# Patient Record
Sex: Female | Born: 1945 | Race: White | Hispanic: No | Marital: Married | Smoking: Never smoker
Health system: Southern US, Community
[De-identification: ages and names within clinical notes are randomized; demographics above are authoritative.]

## PROBLEM LIST (undated history)

## (undated) DIAGNOSIS — I10 Essential (primary) hypertension: Secondary | ICD-10-CM

## (undated) DIAGNOSIS — F32A Depression, unspecified: Secondary | ICD-10-CM

## (undated) DIAGNOSIS — I499 Cardiac arrhythmia, unspecified: Secondary | ICD-10-CM

## (undated) DIAGNOSIS — C801 Malignant (primary) neoplasm, unspecified: Secondary | ICD-10-CM

## (undated) DIAGNOSIS — K219 Gastro-esophageal reflux disease without esophagitis: Secondary | ICD-10-CM

## (undated) DIAGNOSIS — F329 Major depressive disorder, single episode, unspecified: Secondary | ICD-10-CM

## (undated) DIAGNOSIS — M1990 Unspecified osteoarthritis, unspecified site: Secondary | ICD-10-CM

## (undated) HISTORY — PX: ABDOMINAL HYSTERECTOMY: SHX81

## (undated) HISTORY — PX: CHOLECYSTECTOMY: SHX55

---

## 2016-03-10 ENCOUNTER — Emergency Department
Admission: EM | Admit: 2016-03-10 | Discharge: 2016-03-10 | Disposition: A | Discharge status: Home / Self Care | Payer: Medicare Other | Attending: Emergency Medicine | Admitting: Emergency Medicine

## 2016-03-10 ENCOUNTER — Encounter: Payer: Self-pay | Admitting: Emergency Medicine

## 2016-03-10 ENCOUNTER — Emergency Department: Payer: Medicare Other

## 2016-03-10 DIAGNOSIS — K859 Acute pancreatitis without necrosis or infection, unspecified: Secondary | ICD-10-CM | POA: Diagnosis not present

## 2016-03-10 DIAGNOSIS — Z859 Personal history of malignant neoplasm, unspecified: Secondary | ICD-10-CM | POA: Insufficient documentation

## 2016-03-10 DIAGNOSIS — R748 Abnormal levels of other serum enzymes: Secondary | ICD-10-CM | POA: Insufficient documentation

## 2016-03-10 DIAGNOSIS — I10 Essential (primary) hypertension: Secondary | ICD-10-CM | POA: Insufficient documentation

## 2016-03-10 DIAGNOSIS — M1990 Unspecified osteoarthritis, unspecified site: Secondary | ICD-10-CM | POA: Insufficient documentation

## 2016-03-10 DIAGNOSIS — R1013 Epigastric pain: Secondary | ICD-10-CM | POA: Diagnosis present

## 2016-03-10 DIAGNOSIS — F329 Major depressive disorder, single episode, unspecified: Secondary | ICD-10-CM | POA: Diagnosis not present

## 2016-03-10 HISTORY — DX: Major depressive disorder, single episode, unspecified: F32.9

## 2016-03-10 HISTORY — DX: Essential (primary) hypertension: I10

## 2016-03-10 HISTORY — DX: Unspecified osteoarthritis, unspecified site: M19.90

## 2016-03-10 HISTORY — DX: Depression, unspecified: F32.A

## 2016-03-10 HISTORY — DX: Malignant (primary) neoplasm, unspecified: C80.1

## 2016-03-10 HISTORY — DX: Cardiac arrhythmia, unspecified: I49.9

## 2016-03-10 HISTORY — DX: Gastro-esophageal reflux disease without esophagitis: K21.9

## 2016-03-10 LAB — COMPREHENSIVE METABOLIC PANEL
ALK PHOS: 88 U/L (ref 38–126)
ALT: 31 U/L (ref 14–54)
ANION GAP: 10 (ref 5–15)
AST: 56 U/L — ABNORMAL HIGH (ref 15–41)
Albumin: 4.3 g/dL (ref 3.5–5.0)
BILIRUBIN TOTAL: 0.6 mg/dL (ref 0.3–1.2)
BUN: 21 mg/dL — ABNORMAL HIGH (ref 6–20)
CALCIUM: 9.7 mg/dL (ref 8.9–10.3)
CO2: 28 mmol/L (ref 22–32)
Chloride: 101 mmol/L (ref 101–111)
Creatinine, Ser: 0.76 mg/dL (ref 0.44–1.00)
GLUCOSE: 103 mg/dL — AB (ref 65–99)
Potassium: 2.9 mmol/L — CL (ref 3.5–5.1)
SODIUM: 139 mmol/L (ref 135–145)
TOTAL PROTEIN: 7.7 g/dL (ref 6.5–8.1)

## 2016-03-10 LAB — TROPONIN I

## 2016-03-10 LAB — LIPASE, BLOOD: LIPASE: 90 U/L — AB (ref 11–51)

## 2016-03-10 LAB — CBC WITH DIFFERENTIAL/PLATELET
BASOS ABS: 0.1 10*3/uL (ref 0–0.1)
Basophils Relative: 1 %
EOS ABS: 0.3 10*3/uL (ref 0–0.7)
Eosinophils Relative: 4 %
HCT: 36.4 % (ref 35.0–47.0)
HEMOGLOBIN: 12.3 g/dL (ref 12.0–16.0)
LYMPHS ABS: 3.6 10*3/uL (ref 1.0–3.6)
LYMPHS PCT: 37 %
MCH: 31.3 pg (ref 26.0–34.0)
MCHC: 33.7 g/dL (ref 32.0–36.0)
MCV: 92.9 fL (ref 80.0–100.0)
MONO ABS: 0.8 10*3/uL (ref 0.2–0.9)
MONOS PCT: 8 %
NEUTROS ABS: 5 10*3/uL (ref 1.4–6.5)
Neutrophils Relative %: 50 %
PLATELETS: 309 10*3/uL (ref 150–440)
RBC: 3.92 MIL/uL (ref 3.80–5.20)
RDW: 13.4 % (ref 11.5–14.5)
WBC: 9.9 10*3/uL (ref 3.6–11.0)

## 2016-03-10 MED ORDER — OXYCODONE-ACETAMINOPHEN 5-325 MG PO TABS: 1.0000 | ORAL_TABLET | ORAL | Status: AC | PRN

## 2016-03-10 MED ORDER — BARIUM SULFATE 2.1 % PO SUSP: 450.0000 mL | ORAL | Status: AC

## 2016-03-10 MED ORDER — POTASSIUM CHLORIDE CRYS ER 20 MEQ PO TBCR
40.0000 meq | EXTENDED_RELEASE_TABLET | Freq: Once | ORAL | Status: AC
  Administered 2016-03-10: 40 meq via ORAL
  Filled 2016-03-10: qty 2

## 2016-03-10 MED ORDER — MORPHINE SULFATE (PF) 4 MG/ML IV SOLN
4.0000 mg | Freq: Once | INTRAVENOUS | Status: AC
  Administered 2016-03-10: 4 mg via INTRAVENOUS
  Filled 2016-03-10: qty 1

## 2016-03-10 MED ORDER — ONDANSETRON HCL 4 MG/2ML IJ SOLN
4.0000 mg | Freq: Once | INTRAMUSCULAR | Status: AC
Start: 2016-03-10 — End: 2016-03-10
  Administered 2016-03-10: 4 mg via INTRAVENOUS
  Filled 2016-03-10: qty 2

## 2016-03-10 NOTE — ED Provider Notes (Signed)
Signout from Dr. Archie Balboa in this patient who presented with epigastric and lower chest pain. Has a very reassuring CAT scan as well as a normal second troponin. The patient is able to eat and drink at this time and is feeling much relief. The plan per Dr. Archie Balboa was to discharge the patient after by mouth challenge. She was able tolerate her by mouth contrast as well as [employer]. Physical Exam  BP 132/65 mmHg  Pulse 75  Temp(Src) 97.8 F (36.6 C)  Resp 24  Ht 5\' 4"  (1.626 m)  Wt 170 lb (77.111 kg)  BMI 29.17 kg/m2  SpO2 98% ----------------------------------------- 6:47 PM on 03/10/2016 -----------------------------------------   Physical Exam Patient resting up with this time. No complaints at this time. ED Course  Procedures  MDM  CT Abdomen Pelvis Wo Contrast (Final result) Result time: 03/10/16 17:43:46   Final result by Rad Results In Interface (03/10/16 17:43:46)   Narrative:   CLINICAL DATA: Epigastric pain  EXAM: CT ABDOMEN AND PELVIS WITHOUT CONTRAST  TECHNIQUE: Multidetector CT imaging of the abdomen and pelvis was performed following the standard protocol without IV contrast.  COMPARISON: None.  FINDINGS: Lung bases are free of acute infiltrate or sizable effusion.  Gallbladder has been surgically removed. The liver, spleen, adrenal glands and pancreas are within normal limits. Considerable amount retained food stuffs are noted within the stomach. The kidneys show no renal calculi or obstructive changes. The ureters are visualized to the level of the urinary bladder without focal abnormality. The bladder is well distended.  No pelvic mass lesion is seen. The uterus has been surgically removed. The appendix is within normal limits. Fecal material is noted throughout the colon without obstructive change. Mild aortoiliac calcifications are seen without aneurysmal dilatation. A sclerotic focus is noted within the inferior aspect of L2 is noted likely  related to a bone island. Facet hypertrophic changes are noted.  IMPRESSION: Postsurgical changes.  No acute abnormality noted.   Electronically Signed By: Inez Catalina M.D. On: 03/10/2016 17:43          DG Chest 2 View (Final result) Result time: 03/10/16 14:55:23   Final result by Rad Results In Interface (03/10/16 14:55:23)   Narrative:   CLINICAL DATA: Sudden onset of chest pain radiating to the back with diaphoresis.  EXAM: CHEST 2 VIEW  COMPARISON: None.  FINDINGS: Heart size is normal. Slight tortuosity of the thoracic aorta. The lungs are clear. The vascularity is normal. No effusions. No significant bone finding.  IMPRESSION: No active cardiopulmonary disease.   Electronically Signed By: Nelson Chimes M.D. On: 03/10/2016 14:55      Patient without signs of cardiac injury on lab work.  Feeling improved at this time. Has been seeing a cardiologist as an outpatient and is planning for a catheterization. She will be calling her cardiologist tomorrow for follow-up. She knows to return for any worsening or concerning symptoms. Discussed her disposition planning with her and she would like to go home at this time.      Orbie Pyo, MD 03/10/16 [phone]

## 2016-03-10 NOTE — Discharge Instructions (Signed)
Please seek medical attention for any high fevers, chest pain, shortness of breath, change in behavior, persistent vomiting, bloody stool or any other new or concerning symptoms.   Acute Pancreatitis Acute pancreatitis is a disease in which the pancreas becomes suddenly irritated (inflamed). The pancreas is a large gland behind your stomach. The pancreas makes enzymes that help digest food. The pancreas also makes 2 hormones that help control your blood sugar. Acute pancreatitis happens when the enzymes attack and damage the pancreas. Most attacks last a couple of days and can cause serious problems. HOME CARE  Follow your doctor's diet instructions. You may need to avoid alcohol and limit fat in your diet.  Eat small meals often.  Drink enough fluids to keep your pee (urine) clear or pale yellow.  Only take medicines as told by your doctor.  Avoid drinking alcohol if it caused your disease.  Do not smoke.  Get plenty of rest.  Check your blood sugar at home as told by your doctor.  Keep all doctor visits as told. GET HELP IF:  You do not get better as quickly as expected.  You have new or worsening symptoms.  You have lasting pain, weakness, or feel sick to your stomach (nauseous).  You get better and then have another pain attack. GET HELP RIGHT AWAY IF:   You are unable to eat or keep fluids down.  Your pain becomes severe.  You have a fever or lasting symptoms for more than 2 to 3 days.  You have a fever and your symptoms suddenly get worse.  Your skin or the white part of your eyes turn yellow (jaundice).  You throw up (vomit).  You feel dizzy, or you pass out (faint).  Your blood sugar is high (over 300 mg/dL). MAKE SURE YOU:   Understand these instructions.  Will watch your condition.  Will get help right away if you are not doing well or get worse.   This information is not intended to replace advice given to you by your health care provider. Make  sure you discuss any questions you have with your health care provider.   Document Released: 04/01/2008 Document Revised: 11/04/2014 Document Reviewed: 01/23/2012 Elsevier Interactive Patient Education [employer].

## 2016-03-10 NOTE — ED Provider Notes (Signed)
[facility] Emergency Department Provider Note    ____________________________________________  Time seen: ~1355  I have reviewed the triage vital signs and the nursing notes.   HISTORY  Chief Complaint Chest Pain   History limited by: Not Limited   HPI Theresa Riddle is a 70 y.o. female who presents to the emergency department today because of concerns for epigastric pain. The patient states that the pain started roughly 1 hour ago. It is severe. It does radiate into her back. She states she did have some diaphoresis with it. She denies any associated shortness breath. She had just finished eating lunch. She states she has had similar albeit not as severe pain in the past.   Past Medical History  Diagnosis Date  . Arthritis   . Cancer ([place])   . Hypertension   . Acid reflux   . Depression   . Irregular heart beat     There are no active problems to display for this patient.   Past Surgical History  Procedure Laterality Date  . Abdominal hysterectomy    . Cholecystectomy      No current outpatient prescriptions on file.  Allergies Iodine  History reviewed. No pertinent family history.  Social History Social History  Substance Use Topics  . Smoking status: Never Smoker   . Smokeless tobacco: None  . Alcohol Use: No    Review of Systems  Constitutional: Negative for fever. Cardiovascular: Positive for chest pain. Respiratory: Negative for shortness of breath. Gastrointestinal: Negative for abdominal pain, vomiting and diarrhea. Neurological: Negative for headaches, focal weakness or numbness.  10-point ROS otherwise negative.  ____________________________________________   PHYSICAL EXAM:  VITAL SIGNS: ED Triage Vitals  Enc Vitals Group     BP 03/10/16 1340 144/78 mmHg     Pulse Rate 03/10/16 1340 67     Resp 03/10/16 1340 18     Temp 03/10/16 1340 97.8 F (36.6 C)     Temp src --      SpO2 03/10/16 1340 98 %   Weight 03/10/16 1340 170 lb (77.111 kg)     Height 03/10/16 1340 5\' 4"  (1.626 m)     Head Cir --      Peak Flow --      Pain Score 03/10/16 1340 4   Constitutional: Alert and oriented. Appears uncomfortable, diaphoretic Eyes: Conjunctivae are normal. PERRL. Normal extraocular movements. ENT   Head: Normocephalic and atraumatic.   Nose: No congestion/rhinnorhea.   Mouth/Throat: Mucous membranes are moist.   Neck: No stridor. Hematological/Lymphatic/Immunilogical: No cervical lymphadenopathy. Cardiovascular: Normal rate, regular rhythm.  No murmurs, rubs, or gallops. Respiratory: Normal respiratory effort without tachypnea nor retractions. Breath sounds are clear and equal bilaterally. No wheezes/rales/rhonchi. Gastrointestinal: Soft and nontender. No distention.  Genitourinary: Deferred Musculoskeletal: Normal range of motion in all extremities. No joint effusions.  No lower extremity tenderness nor edema. Neurologic:  Normal speech and language. No gross focal neurologic deficits are appreciated.  Skin:  Skin is warm, and diaphoretic Psychiatric: Mood and affect are normal. Speech and behavior are normal. Patient exhibits appropriate insight and judgment.  ____________________________________________    LABS (pertinent positives/negatives)  Labs Reviewed  COMPREHENSIVE METABOLIC PANEL - Abnormal; Notable for the following:    Potassium 2.9 (*)    Glucose, Bld 103 (*)    BUN 21 (*)    AST 56 (*)    All other components within normal limits  LIPASE, BLOOD - Abnormal; Notable for the following:    Lipase 90 (*)  All other components within normal limits  CBC WITH DIFFERENTIAL/PLATELET  TROPONIN I     ____________________________________________   EKG  I, Nance Pear, attending physician, personally viewed and interpreted this EKG  EKG Time: 1337 Rate: 81 Rhythm: normal sinus rhythm with PACs Axis: normal Intervals: qtc 377 QRS: narrow ST changes:  no st elevation Impression: abnormal EKG  I, Nance Pear, attending physician, personally viewed and interpreted this EKG  EKG Time: 1359 Rate: 66 Rhythm: normal sinus rhythm with PACs Axis: normal Intervals: qtc 438 QRS: narrow ST changes: no st elevation Impression: abnormal ekg   ____________________________________________    RADIOLOGY  CT pending   ___________________________________________   PROCEDURES  Procedure(s) performed: None  Critical Care performed: No  ____________________________________________   INITIAL IMPRESSION / ASSESSMENT AND PLAN / ED COURSE  Pertinent labs & imaging results that were available during my care of the patient were reviewed by me and considered in my medical decision making (see chart for details).  Patient presented to the emergency department today because of concerns for epigastric pain. My exam patient. Uncomfortable and was somewhat diaphoretic. Repeat EKG was performed which did not show any concerning ST elevation or significant change from previous EKG. Will give pain medication. Will check blood work and reassess.  ----------------------------------------- 4:32 PM on 03/10/2016 -----------------------------------------  Lipase was elevated and on blood work. Check CT scan.  ____________________________________________   FINAL CLINICAL IMPRESSION(S) / ED DIAGNOSES  Abdominal pain Elevated lipase  Nance Pear, MD 03/10/16 [phone]

## 2016-03-10 NOTE — ED Notes (Signed)
Pt had sudden onset while eating of chest pain going into her back and became cold and diaphoretic

## 2016-09-23 IMAGING — CR DG CHEST 2V
1 series · 2 of 2 positions shown · non-contrast
Comparison: None.

CLINICAL DATA: Sudden onset of chest pain radiating to the back
with diaphoresis.

EXAM:
CHEST  2 VIEW

[Series 1: dg chest 2 view · 0.14mm/px · 2 of 2 slices shown]
[im 1/2]
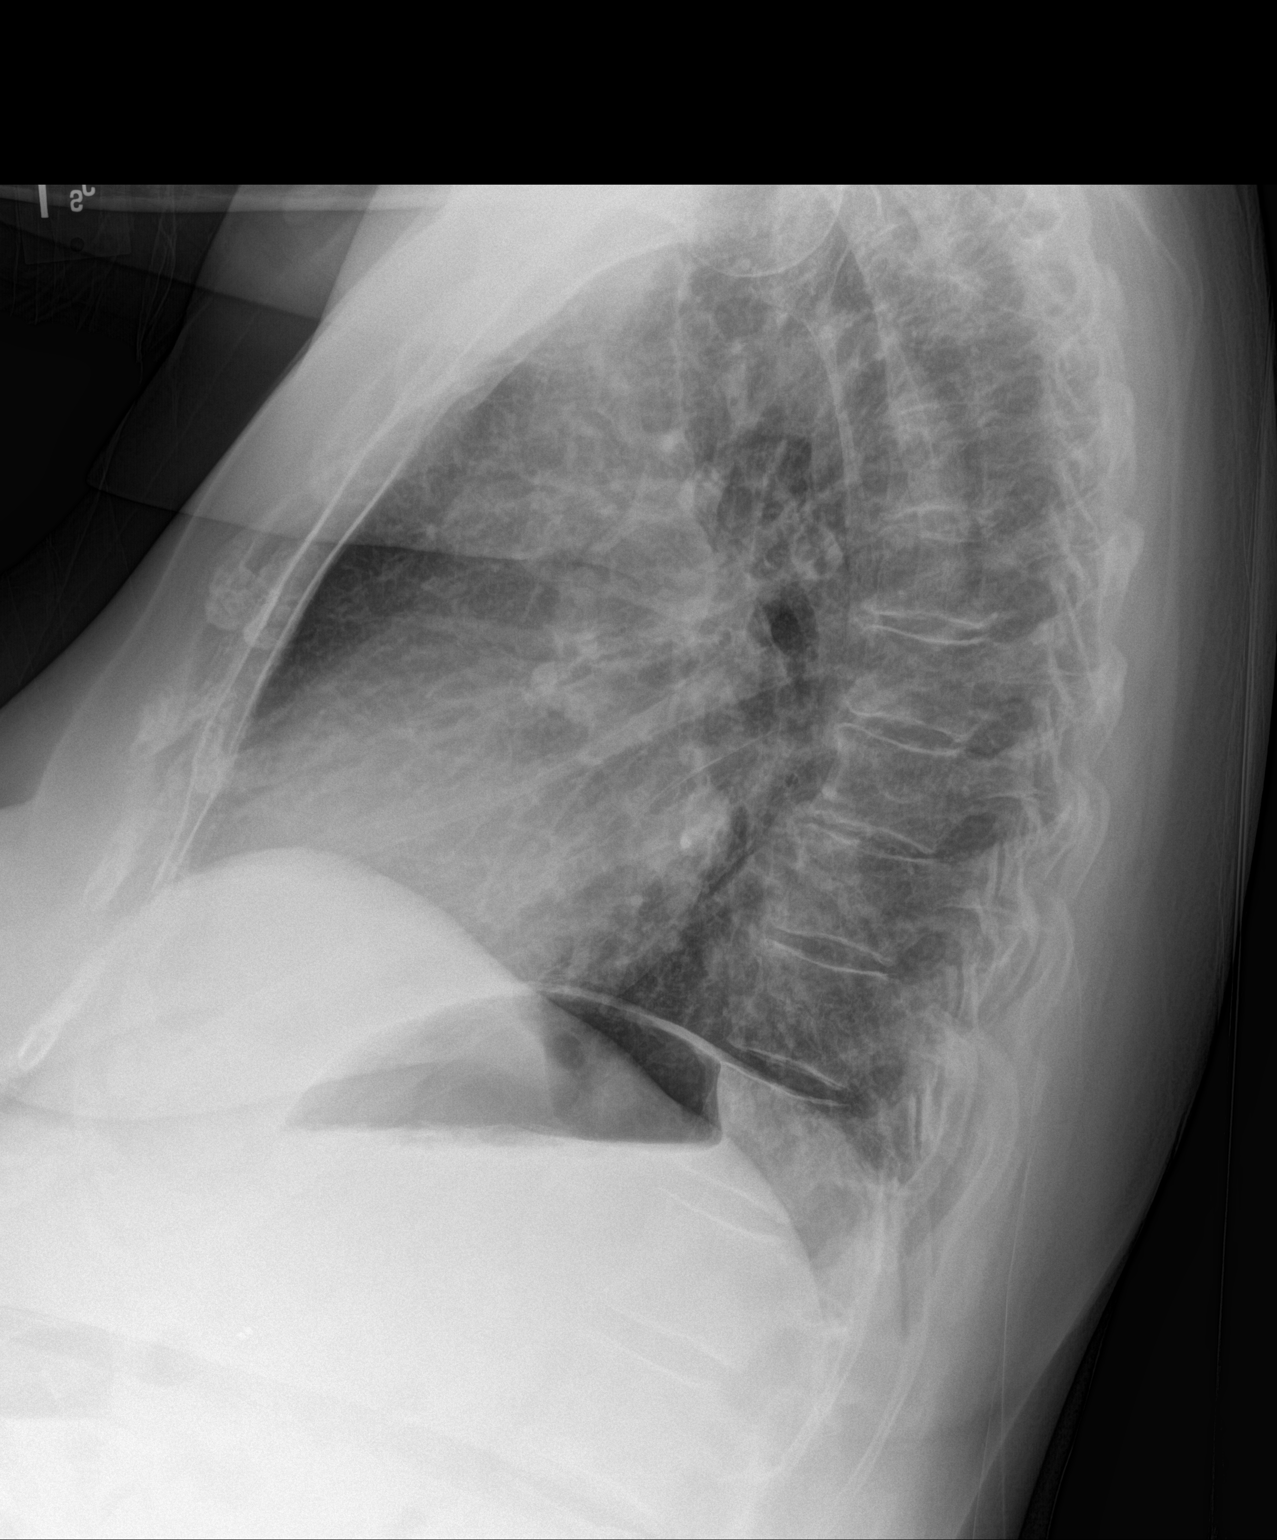
[im 2/2]
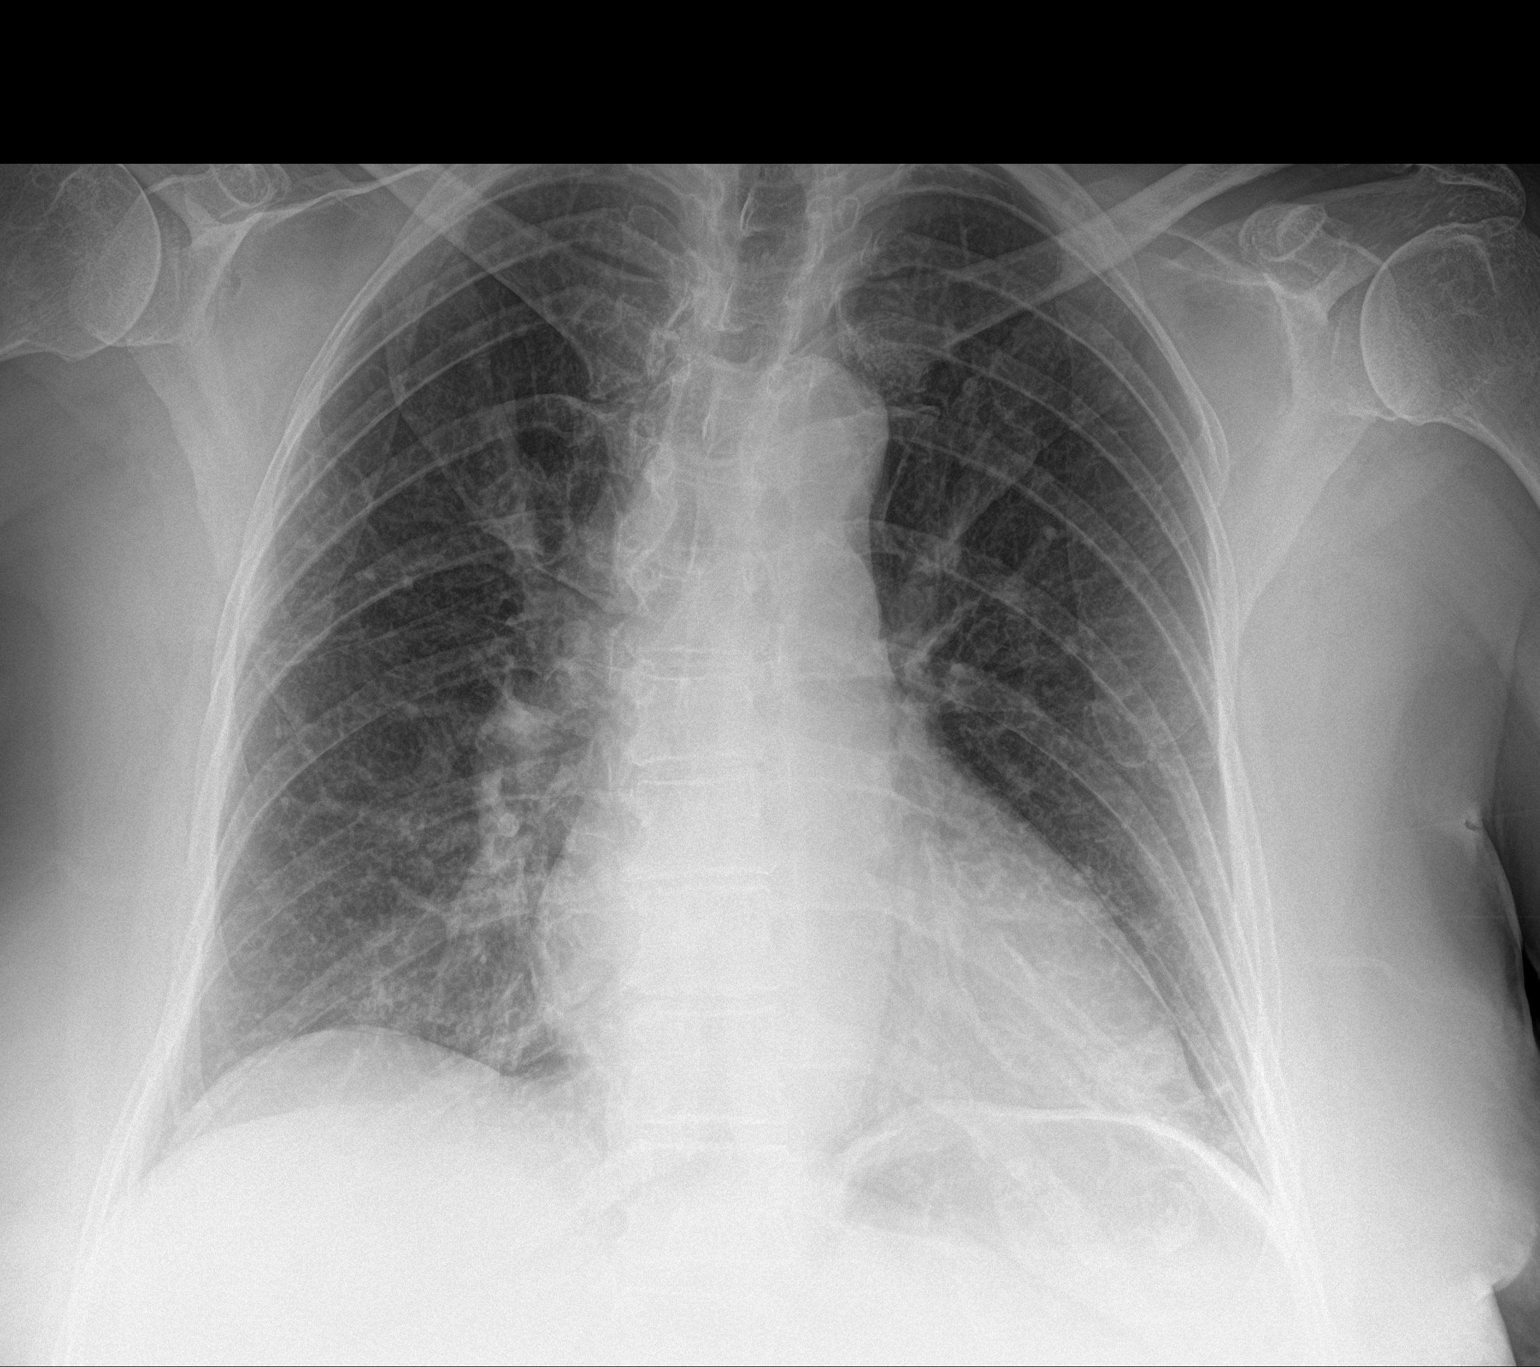

[2 of 2 positions shown; findings below may reference images not displayed]

FINDINGS: Heart size is normal. Slight tortuosity of the thoracic aorta. The
lungs are clear. The vascularity is normal. No effusions. No
significant bone finding.
IMPRESSION: No active cardiopulmonary disease.
